# Patient Record
Sex: Female | Born: 1985 | Race: White | Hispanic: No | Marital: Single | Smoking: Former smoker
Health system: Southern US, Community
[De-identification: ages and names within clinical notes are randomized; demographics above are authoritative.]

## PROBLEM LIST (undated history)

## (undated) DIAGNOSIS — F259 Schizoaffective disorder, unspecified: Secondary | ICD-10-CM

## (undated) DIAGNOSIS — F250 Schizoaffective disorder, bipolar type: Secondary | ICD-10-CM

## (undated) DIAGNOSIS — G809 Cerebral palsy, unspecified: Secondary | ICD-10-CM

## (undated) DIAGNOSIS — C801 Malignant (primary) neoplasm, unspecified: Secondary | ICD-10-CM

## (undated) HISTORY — PX: EYE SURGERY: SHX253

## (undated) HISTORY — PX: BLADDER TUMOR EXCISION: SHX238

---

## 2016-12-10 ENCOUNTER — Emergency Department (HOSPITAL_COMMUNITY)
Admission: EM | Admit: 2016-12-10 | Discharge: 2016-12-10 | Disposition: A | Discharge status: Home / Self Care | Payer: Medicare (Managed Care) | Attending: Emergency Medicine | Admitting: Emergency Medicine

## 2016-12-10 ENCOUNTER — Emergency Department (HOSPITAL_COMMUNITY): Payer: Medicare (Managed Care)

## 2016-12-10 ENCOUNTER — Encounter (HOSPITAL_COMMUNITY): Payer: Self-pay | Admitting: Emergency Medicine

## 2016-12-10 DIAGNOSIS — M545 Low back pain, unspecified: Secondary | ICD-10-CM

## 2016-12-10 DIAGNOSIS — Z79899 Other long term (current) drug therapy: Secondary | ICD-10-CM | POA: Insufficient documentation

## 2016-12-10 DIAGNOSIS — K920 Hematemesis: Secondary | ICD-10-CM | POA: Diagnosis present

## 2016-12-10 DIAGNOSIS — Z8551 Personal history of malignant neoplasm of bladder: Secondary | ICD-10-CM | POA: Insufficient documentation

## 2016-12-10 DIAGNOSIS — R300 Dysuria: Secondary | ICD-10-CM | POA: Diagnosis not present

## 2016-12-10 DIAGNOSIS — Z87891 Personal history of nicotine dependence: Secondary | ICD-10-CM | POA: Diagnosis not present

## 2016-12-10 HISTORY — DX: Malignant (primary) neoplasm, unspecified: C80.1

## 2016-12-10 HISTORY — DX: Cerebral palsy, unspecified: G80.9

## 2016-12-10 HISTORY — DX: Schizoaffective disorder, bipolar type: F25.0

## 2016-12-10 HISTORY — DX: Schizoaffective disorder, unspecified: F25.9

## 2016-12-10 LAB — URINALYSIS, ROUTINE W REFLEX MICROSCOPIC
BILIRUBIN URINE: NEGATIVE
Glucose, UA: NEGATIVE mg/dL
KETONES UR: NEGATIVE mg/dL
NITRITE: NEGATIVE
Protein, ur: NEGATIVE mg/dL
SPECIFIC GRAVITY, URINE: 1.004 — AB (ref 1.005–1.030)
pH: 8 (ref 5.0–8.0)

## 2016-12-10 LAB — CBC
HEMATOCRIT: 42.4 % (ref 36.0–46.0)
HEMOGLOBIN: 15.1 g/dL — AB (ref 12.0–15.0)
MCH: 32.2 pg (ref 26.0–34.0)
MCHC: 35.6 g/dL (ref 30.0–36.0)
MCV: 90.4 fL (ref 78.0–100.0)
Platelets: 430 10*3/uL — ABNORMAL HIGH (ref 150–400)
RBC: 4.69 MIL/uL (ref 3.87–5.11)
RDW: 11.7 % (ref 11.5–15.5)
WBC: 13.2 10*3/uL — AB (ref 4.0–10.5)

## 2016-12-10 LAB — COMPREHENSIVE METABOLIC PANEL
ALT: 48 U/L (ref 14–54)
ANION GAP: 10 (ref 5–15)
AST: 47 U/L — ABNORMAL HIGH (ref 15–41)
Albumin: 5.1 g/dL — ABNORMAL HIGH (ref 3.5–5.0)
Alkaline Phosphatase: 65 U/L (ref 38–126)
BUN: 6 mg/dL (ref 6–20)
CALCIUM: 9.9 mg/dL (ref 8.9–10.3)
CHLORIDE: 105 mmol/L (ref 101–111)
CO2: 25 mmol/L (ref 22–32)
CREATININE: 0.71 mg/dL (ref 0.44–1.00)
Glucose, Bld: 95 mg/dL (ref 65–99)
Potassium: 4.7 mmol/L (ref 3.5–5.1)
Sodium: 140 mmol/L (ref 135–145)
Total Bilirubin: 0.4 mg/dL (ref 0.3–1.2)
Total Protein: 8.4 g/dL — ABNORMAL HIGH (ref 6.5–8.1)

## 2016-12-10 LAB — LIPASE, BLOOD: LIPASE: 29 U/L (ref 11–51)

## 2016-12-10 LAB — PREGNANCY, URINE: PREG TEST UR: NEGATIVE

## 2016-12-10 LAB — I-STAT TROPONIN, ED: TROPONIN I, POC: 0 ng/mL (ref 0.00–0.08)

## 2016-12-10 LAB — POC OCCULT BLOOD, ED: FECAL OCCULT BLD: NEGATIVE

## 2016-12-10 MED ORDER — PHENAZOPYRIDINE HCL 100 MG PO TABS
100.0000 mg | ORAL_TABLET | Freq: Three times a day (TID) | ORAL | Status: DC
  Administered 2016-12-10: 100 mg via ORAL
  Filled 2016-12-10: qty 1

## 2016-12-10 MED ORDER — SODIUM CHLORIDE 0.9 % IV BOLUS (SEPSIS)
1000.0000 mL | Freq: Once | INTRAVENOUS | Status: AC
  Administered 2016-12-10: 1000 mL via INTRAVENOUS

## 2016-12-10 MED ORDER — PANTOPRAZOLE SODIUM 40 MG IV SOLR
40.0000 mg | Freq: Once | INTRAVENOUS | Status: AC
  Administered 2016-12-10: 40 mg via INTRAVENOUS
  Filled 2016-12-10: qty 40

## 2016-12-10 MED ORDER — FAMOTIDINE 20 MG PO TABS: 20.0000 mg | ORAL_TABLET | Freq: Two times a day (BID) | ORAL | 0 refills | Status: AC

## 2016-12-10 MED ORDER — ONDANSETRON HCL 4 MG PO TABS: 4.0000 mg | ORAL_TABLET | Freq: Three times a day (TID) | ORAL | 0 refills | Status: AC | PRN

## 2016-12-10 MED ORDER — SULFAMETHOXAZOLE-TRIMETHOPRIM 800-160 MG PO TABS: 1.0000 | ORAL_TABLET | Freq: Two times a day (BID) | ORAL | 0 refills | Status: AC

## 2016-12-10 MED ORDER — ONDANSETRON HCL 4 MG/2ML IJ SOLN
4.0000 mg | Freq: Once | INTRAMUSCULAR | Status: AC
  Administered 2016-12-10: 4 mg via INTRAVENOUS
  Filled 2016-12-10: qty 2

## 2016-12-10 MED ORDER — PHENAZOPYRIDINE HCL 200 MG PO TABS: 200.0000 mg | ORAL_TABLET | Freq: Three times a day (TID) | ORAL | 0 refills | Status: AC

## 2016-12-10 NOTE — Discharge Instructions (Signed)
Read the information below.  Use the prescribed medication as directed.  Please discuss all new medications with your pharmacist.  You may return to the Emergency Department at any time for worsening condition or any new symptoms that concern you.   If you develop high fevers, worsening abdominal pain, uncontrolled vomiting, or are unable to tolerate fluids by mouth, return to the ER for a recheck.  ° °

## 2016-12-10 NOTE — ED Notes (Signed)
Bladder scan  Post void less than 20 ml

## 2016-12-10 NOTE — ED Notes (Signed)
ED Provider at bedside. 

## 2016-12-10 NOTE — ED Notes (Signed)
Occult card at bedside 

## 2016-12-10 NOTE — ED Provider Notes (Signed)
[facility] DEPT Provider Note   CSN: [id number] Arrival date & time: 12/10/16  1200     History   Chief Complaint Chief Complaint  Patient presents with  . Hematemesis    HPI Karen Middleton is a 31 y.o. female.  HPI   Pt with hx cerebral palsy, lynch syndrome, bladder tumor, schizophrenia p/w episode of vomiting with hematemesis that occurred PTA.  States she has not been feeling well for the past two days with myalgias, subjective fevers, sweating.  Dysuria and urinary urgency last night. Has had intermittent back pain that has been progressively worsening over the past month, low back, radiates to the right, aching, intermittent.  She has also been thirsty recently.    Denies nasal congestion, sore throat, cough, CP, SOB.  Normal BM yesterday.  Denies hematochezia or melena.  Denies fevers, chills, abdominal pain, loss of control of bowel or bladder, weakness of numbness of the extremities, saddle anesthesia, No hx CA, IVDU.  Denies vaginal symptoms.  Denies frequent or heavy ETOH use.   Pt lives in [place] and is here visiting, plans to go home in two days. Has regular colonoscopies and cystoscopies, last in 2016.    Past Medical History:  Diagnosis Date  . Cancer ([place])    bladder  . Cerebral palsy ([place])   . Schizo affective schizophrenia ([place])     There are no active problems to display for this patient.   Past Surgical History:  Procedure Laterality Date  . BLADDER TUMOR EXCISION    . EYE SURGERY      OB History    No data available       Home Medications    Prior to Admission medications   Medication Sig Start Date End Date Taking? Authorizing Provider  b complex vitamins tablet Take 1 tablet by mouth daily.   Yes Historical Provider, MD  buPROPion (WELLBUTRIN XL) 150 MG 24 hr tablet Take 150 mg by mouth daily. 12/04/16  Yes Historical Provider, MD  Cholecalciferol (VITAMIN D PO) Take 1 tablet by mouth daily.   Yes Historical Provider, MD    LORazepam (ATIVAN) 0.5 MG tablet Take 0.5 mg by mouth every 8 (eight) hours.   Yes Historical Provider, MD  Oxcarbazepine (TRILEPTAL) 300 MG tablet Take 300 mg by mouth 2 (two) times daily.   Yes Historical Provider, MD  famotidine (PEPCID) 20 MG tablet Take 1 tablet (20 mg total) by mouth 2 (two) times daily. 12/10/16   Clayton Bibles, PA-C  ondansetron (ZOFRAN) 4 MG tablet Take 1 tablet (4 mg total) by mouth every 8 (eight) hours as needed for nausea or vomiting. 12/10/16   Clayton Bibles, PA-C  phenazopyridine (PYRIDIUM) 200 MG tablet Take 1 tablet (200 mg total) by mouth 3 (three) times daily. 12/10/16   Clayton Bibles, PA-C  sulfamethoxazole-trimethoprim (BACTRIM DS,SEPTRA DS) 800-160 MG tablet Take 1 tablet by mouth 2 (two) times daily. X 7 days 12/10/16   Clayton Bibles, PA-C    Family History No family history on file.  Social History Social History  Substance Use Topics  . Smoking status: Former Research scientist (life sciences)  . Smokeless tobacco: Not on file  . Alcohol use No     Allergies   Penicillins   Review of Systems Review of Systems  All other systems reviewed and are negative.    Physical Exam Updated Vital Signs BP (!) 140/94   Pulse 83   Temp 98.1 F (36.7 C) (Oral)   Resp 18   Ht 5\' 4"  (  1.626 m)   Wt 63.5 kg   SpO2 97%   BMI 24.03 kg/m   Physical Exam  Constitutional: She appears well-developed and well-nourished. No distress.  HENT:  Head: Normocephalic and atraumatic.  Neck: Neck supple.  Cardiovascular: Normal rate and regular rhythm.   Pulmonary/Chest: Effort normal and breath sounds normal. No respiratory distress. She has no wheezes. She has no rales.  Abdominal: Soft. She exhibits no distension. There is tenderness in the epigastric area. There is no rigidity, no rebound, no guarding and no CVA tenderness.  Genitourinary: Rectum normal.  Genitourinary Comments: No frank blood on exam.  Small amount of brown stool on glove.    Neurological: She is alert.  Skin: She is not  diaphoretic.  Nursing note and vitals reviewed.    ED Treatments / Results  Labs (all labs ordered are listed, but only abnormal results are displayed) Labs Reviewed  CBC - Abnormal; Notable for the following:       Result Value   WBC 13.2 (*)    Hemoglobin 15.1 (*)    Platelets 430 (*)    All other components within normal limits  URINALYSIS, ROUTINE W REFLEX MICROSCOPIC - Abnormal; Notable for the following:    Color, Urine STRAW (*)    Specific Gravity, Urine 1.004 (*)    Hgb urine dipstick MODERATE (*)    Leukocytes, UA TRACE (*)    Bacteria, UA RARE (*)    Squamous Epithelial / LPF 0-5 (*)    All other components within normal limits  COMPREHENSIVE METABOLIC PANEL - Abnormal; Notable for the following:    Total Protein 8.4 (*)    Albumin 5.1 (*)    AST 47 (*)    All other components within normal limits  URINE CULTURE  PREGNANCY, URINE  LIPASE, BLOOD  OCCULT BLOOD X 1 CARD TO LAB, STOOL  I-STAT TROPOININ, ED  POC OCCULT BLOOD, ED    EKG  EKG Interpretation None       Radiology Dg Lumbar Spine Complete  Result Date: 12/10/2016 CLINICAL DATA:  Low back pain for several weeks with no known injury. History of bladder malignancy, multiple scleroses, and schizoaffective schizophrenia. EXAM: LUMBAR SPINE - COMPLETE 4+ VIEW COMPARISON:  None in PACs FINDINGS: The lumbar vertebral bodies are preserved in height. There is mild disc space narrowing at L4-5. There is no spondylolisthesis. There is mild facet joint hypertrophy at L4-5 and L5-S1. The pedicles and transverse processes are intact. The observed portions of the sacrum are normal. An IUD is present. The bowel gas pattern is normal. IMPRESSION: Mild degenerative facet joint change at L4-5 and L5-S1 with mild disc space narrowing at L4-5. No compression fracture or spondylolisthesis. Electronically Signed   By: David  Martinique M.D.   On: 12/10/2016 13:59   Ct Renal Stone Study  Result Date: 12/10/2016 CLINICAL DATA:   31 year old female with lumbar back pain and gross hematuria. Negative pregnancy test and IUD. EXAM: CT ABDOMEN AND PELVIS WITHOUT CONTRAST TECHNIQUE: Multidetector CT imaging of the abdomen and pelvis was performed following the standard protocol without IV contrast. COMPARISON:  Lumbar radiographs 1342 hours today. FINDINGS: Lower chest: Negative; tiny subpleural nodule in the right middle lobe on series 4, image 9 appears postinflammatory. No pleural or pericardial effusion. Hepatobiliary: Negative noncontrast liver and gallbladder. Pancreas: Negative. Spleen: Negative. Adrenals/Urinary Tract: Normal adrenal glands. No nephrolithiasis and normal noncontrast CT appearance of both kidneys. No periureteral stranding. Mild asymmetric prominence of the right ureter. Normal ureterovesical junctions.  Mildly distended (estimated urinary bladder volume 498 mL) but otherwise unremarkable urinary bladder. Stomach/Bowel: Negative rectum aside from some proximal retained stool. Redundant but otherwise negative sigmoid colon. Negative left colon. Negative transverse colon. Negative right colon aside from some retained stool. Normal appendix (series 2, image 54). The cecum is on a somewhat lax mesenteric. Negative terminal ileum. No dilated small bowel. Negative stomach and duodenum. No abdominal free fluid. Vascular/Lymphatic: Vascular patency is not evaluated in the absence of IV contrast. Reproductive: IUD in place with negative noncontrast appearance of the uterus and adnexa. Other: No pelvic free fluid. Musculoskeletal: Widespread mild lumbar facet hypertrophy. No acute osseous abnormality identified. Previous care IMPRESSION: 1. No urologic calculus or evidence of obstructive uropathy. Distended urinary bladder (estimated bladder volume 498 mL) is otherwise unremarkable. 2. Otherwise negative noncontrast CT Abdomen and Pelvis. IUD with no adverse features. Electronically Signed   By: Genevie Ann M.D.   On: 12/10/2016 14:34     Procedures Procedures (including critical care time)  Medications Ordered in ED Medications  phenazopyridine (PYRIDIUM) tablet 100 mg (0 mg Oral Hold 12/10/16 1608)  sodium chloride 0.9 % bolus 1,000 mL (0 mLs Intravenous Stopped 12/10/16 1513)  ondansetron (ZOFRAN) injection 4 mg (4 mg Intravenous Given 12/10/16 1301)  pantoprazole (PROTONIX) injection 40 mg (40 mg Intravenous Given 12/10/16 1301)     Initial Impression / Assessment and Plan / ED Course  I have reviewed the triage vital signs and the nursing notes.  Pertinent labs & imaging results that were available during my care of the patient were reviewed by me and considered in my medical decision making (see chart for details).    Afebrile, nontoxic patient with few days of myalgias, one day of urinary symptoms, 1 month of low back pain.  Reported vomiting with hematemesis today.  No vomiting in ED.  Abdominal exam benign. Hgb actually elevated, possible mild dehydration.   Workup significant for leukocytosis, hematuria.  CT abd/pelvis without acute finding.  She did have full bladder but was able to void completely.  Engaged in medical decisionmaking with patient.  Given constellation of symptoms and [place], decided to treat for presumed UTI.  D/C home with keflex, pyridium, pepcid, zofran.  Close PCP follow up when she returns home.  Discussed strict return precautions.  Discussed result, findings, treatment, and follow up  with patient.  Pt given return precautions.  Pt verbalizes understanding and agrees with plan.       Final Clinical Impressions(s) / ED Diagnoses   Final diagnoses:  Hematemesis with nausea  Dysuria  Acute bilateral low back pain without sciatica    New Prescriptions Discharge Medication List as of 12/10/2016  4:05 PM    START taking these medications   Details  famotidine (PEPCID) 20 MG tablet Take 1 tablet (20 mg total) by mouth 2 (two) times daily., Starting Mon 12/10/2016, Print     ondansetron (ZOFRAN) 4 MG tablet Take 1 tablet (4 mg total) by mouth every 8 (eight) hours as needed for nausea or vomiting., Starting Mon 12/10/2016, Print    phenazopyridine (PYRIDIUM) 200 MG tablet Take 1 tablet (200 mg total) by mouth 3 (three) times daily., Starting Mon 12/10/2016, Print    sulfamethoxazole-trimethoprim (BACTRIM DS,SEPTRA DS) 800-160 MG tablet Take 1 tablet by mouth 2 (two) times daily. X 7 days, Starting Mon 12/10/2016, Print         [place], [place] 12/10/16 [place], MD 12/11/16 2147

## 2016-12-10 NOTE — ED Notes (Addendum)
Water given for PO challege

## 2016-12-10 NOTE — ED Triage Notes (Signed)
Pt reports this am she had an episode of hematemesis. Pt reports hx of bladder cancer when she was 75 but is checked regularly. Pt reports flank pain for past few weeks. Pt also adds some generalized body aches, but has muscular disability and is common for her.

## 2016-12-10 NOTE — ED Notes (Signed)
Patient was alert, oriented and stable upon discharge. RN went over AVS and patient had no further questions.  

## 2016-12-11 LAB — URINE CULTURE

## 2017-10-24 IMAGING — CT CT RENAL STONE PROTOCOL
2 of 3 series · 16 of 46 positions shown, 18 images · non-contrast
Comparison: Lumbar radiographs 7716 hours today.

CLINICAL DATA: 30-year-old female with lumbar back pain and gross
hematuria. Negative pregnancy test and IUD.

EXAM:
CT ABDOMEN AND PELVIS WITHOUT CONTRAST
TECHNIQUE: Multidetector CT imaging of the abdomen and pelvis was performed
following the standard protocol without IV contrast.

[Series 4: lung · axial · 0.72mm/px · z∈[-121,-13]mm · 13 of 62 slices shown, 15 images]
[im 4/62  soft-tissue]
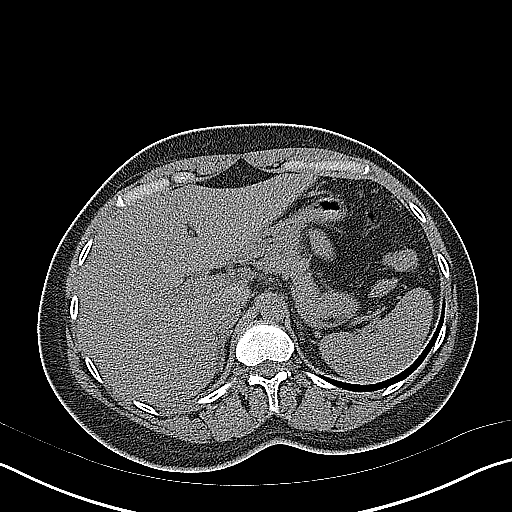
[im 4/62  bone]
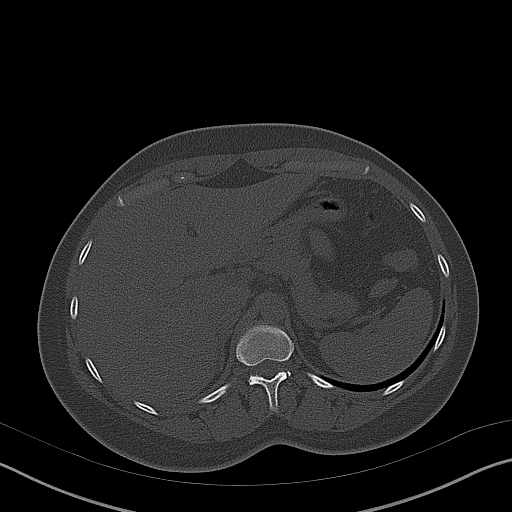
[im 8/62  soft-tissue]
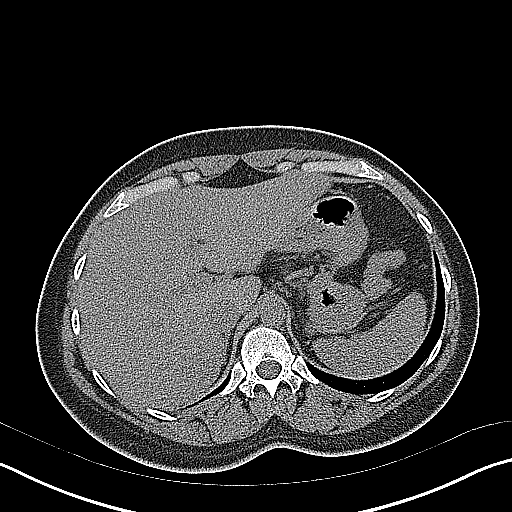
[im 12/62  soft-tissue]
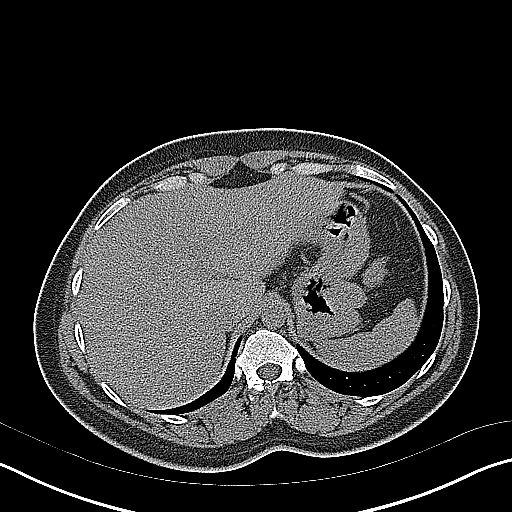
[im 18/62  soft-tissue]
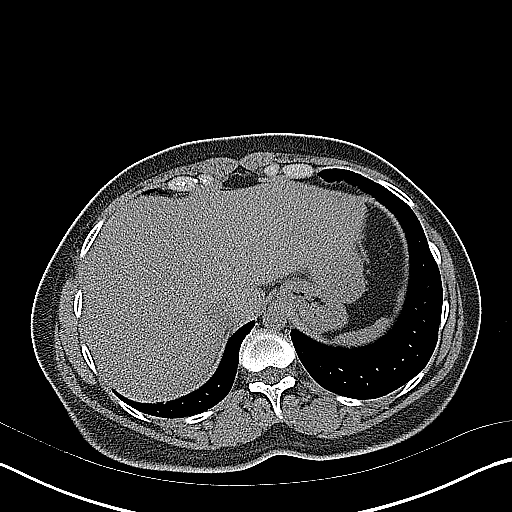
[im 22/62  soft-tissue]
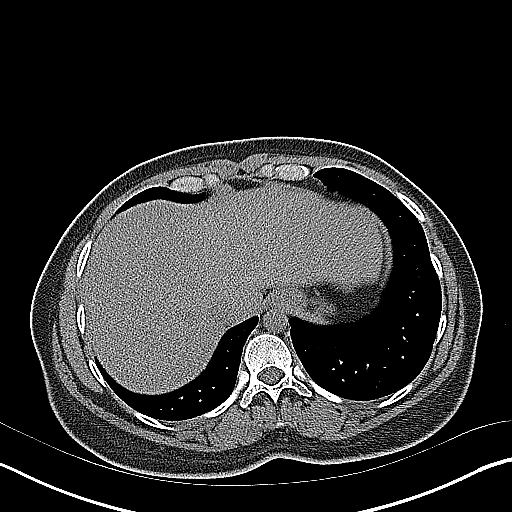
[im 26/62  soft-tissue]
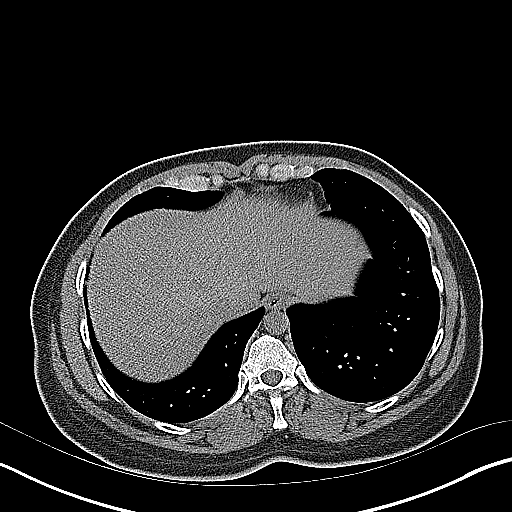
[im 32/62  soft-tissue]
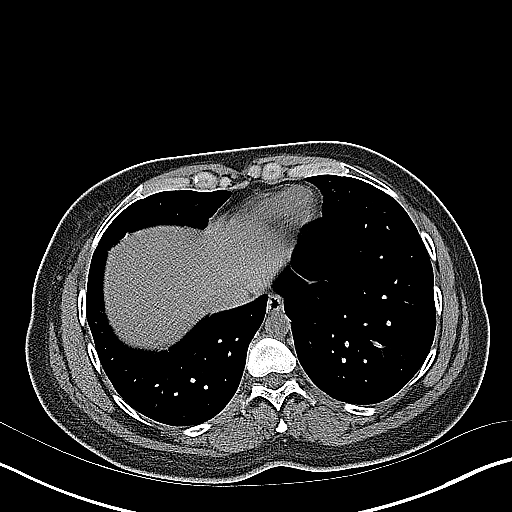
[im 36/62  soft-tissue]
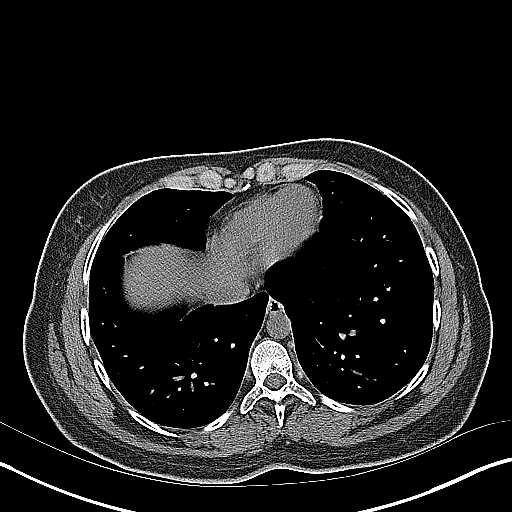
[im 40/62  soft-tissue]
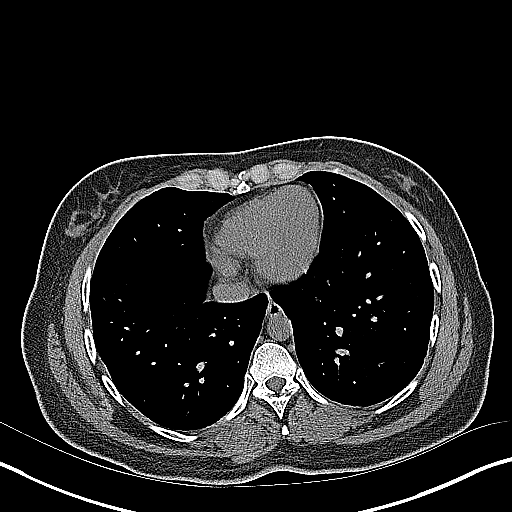
[im 40/62  bone]
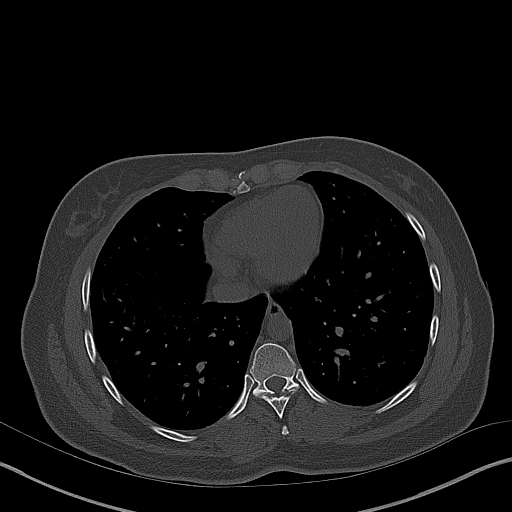
[im 44/62  soft-tissue]
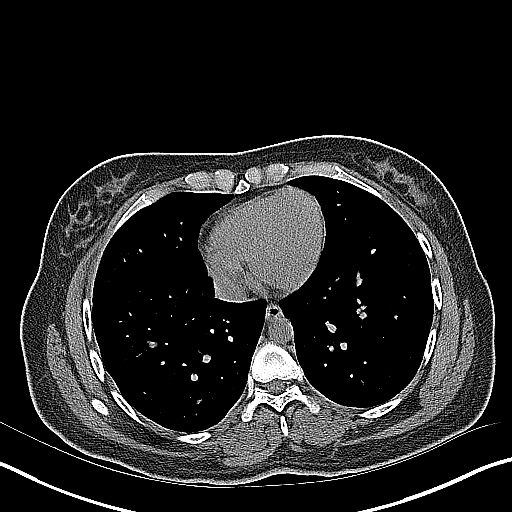
[im 50/62  soft-tissue]
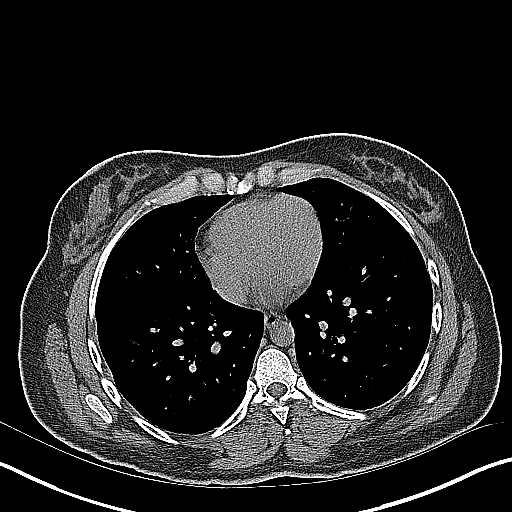
[im 54/62  soft-tissue]
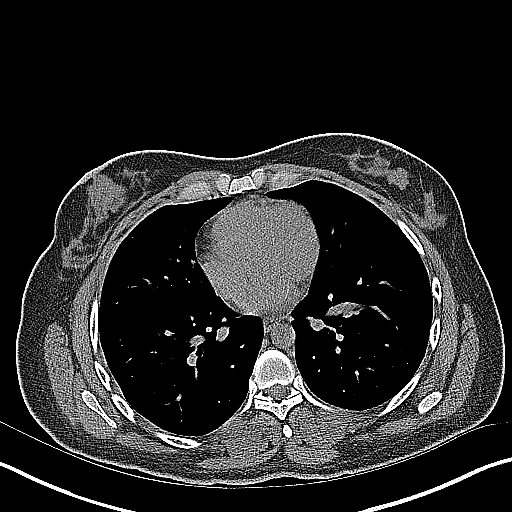
[im 58/62  soft-tissue]
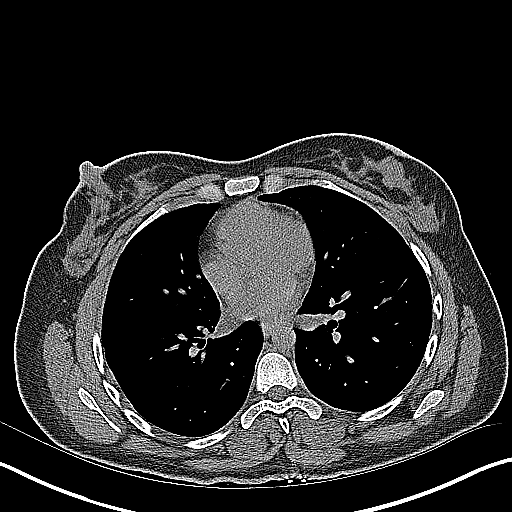

[Series 5: coronal · coronal · 0.78mm/px · 3 of 134 slices shown]
[im 45/134  soft-tissue]
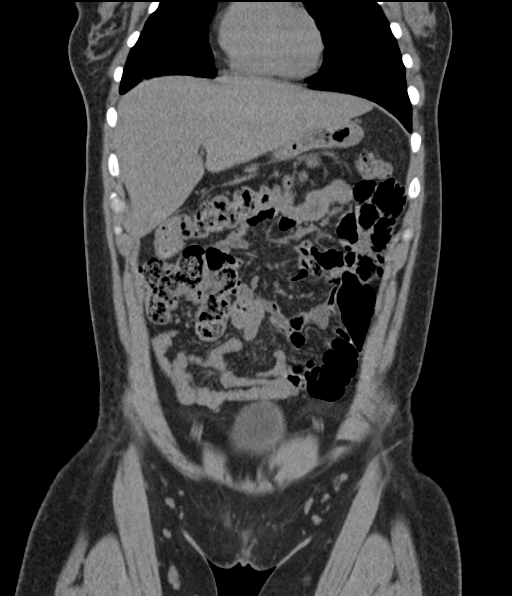
[im 60/134  soft-tissue]
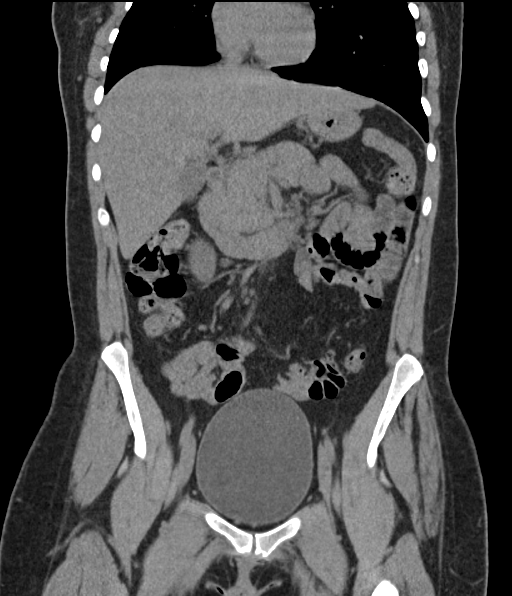
[im 74/134  soft-tissue]
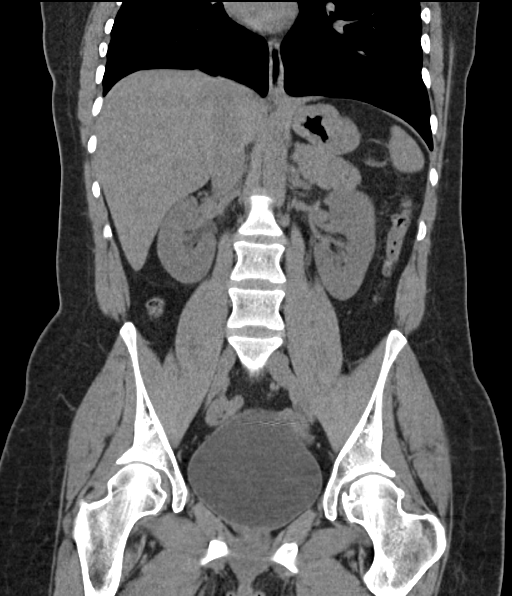

[16 of 46 positions shown; findings below may reference images not displayed]

FINDINGS: Lower chest: Negative; tiny subpleural nodule in the right middle
lobe on series 4, image 9 appears postinflammatory. No pleural or
pericardial effusion.

Hepatobiliary: Negative noncontrast liver and gallbladder.

Pancreas: Negative.

Spleen: Negative.

Adrenals/Urinary Tract: Normal adrenal glands.

No nephrolithiasis and normal noncontrast CT appearance of both
kidneys. No periureteral stranding. Mild asymmetric prominence of
the right ureter. Normal ureterovesical junctions. Mildly distended
(estimated urinary bladder volume 498 mL) but otherwise unremarkable
urinary bladder.

Stomach/Bowel: Negative rectum aside from some proximal retained
stool. Redundant but otherwise negative sigmoid colon. Negative left
colon. Negative transverse colon. Negative right colon aside from
some retained stool. Normal appendix (series 2, image 54). The cecum
is on a somewhat lax mesenteric. Negative terminal ileum. No dilated
small bowel.

Negative stomach and duodenum.

No abdominal free fluid.

Vascular/Lymphatic: Vascular patency is not evaluated in the absence
of IV contrast.

Reproductive: IUD in place with negative noncontrast appearance of
the uterus and adnexa.

Other: No pelvic free fluid.

Musculoskeletal: Widespread mild lumbar facet hypertrophy. No acute
osseous abnormality identified. Previous care
IMPRESSION: 1. No urologic calculus or evidence of obstructive uropathy.
Distended urinary bladder (estimated bladder volume 498 mL) is
otherwise unremarkable.
2. Otherwise negative noncontrast CT Abdomen and Pelvis. IUD with no
adverse features.
